# Patient Record
Sex: Male | Born: 1970 | Race: White | Hispanic: No | Marital: Married | Smoking: Never smoker
Health system: Southern US, Community
[De-identification: ages and names within clinical notes are randomized; demographics above are authoritative.]

---

## 2010-08-04 ENCOUNTER — Ambulatory Visit: Payer: Self-pay | Admitting: Family Medicine

## 2010-08-05 ENCOUNTER — Ambulatory Visit: Payer: Self-pay | Admitting: Internal Medicine

## 2011-05-26 ENCOUNTER — Other Ambulatory Visit: Payer: Self-pay | Admitting: Ophthalmology

## 2011-05-26 ENCOUNTER — Ambulatory Visit: Payer: Self-pay | Admitting: Internal Medicine

## 2013-04-14 ENCOUNTER — Ambulatory Visit: Payer: Self-pay | Admitting: Internal Medicine

## 2014-09-24 IMAGING — CR DG CHEST 2V
1 series · 3 of 3 positions shown · non-contrast
Comparison: none

REASON FOR EXAM: atypical CP
COMMENTS:

[Series 1: pa · 0.17mm/px · 3 of 3 slices shown]
[im 1/3]
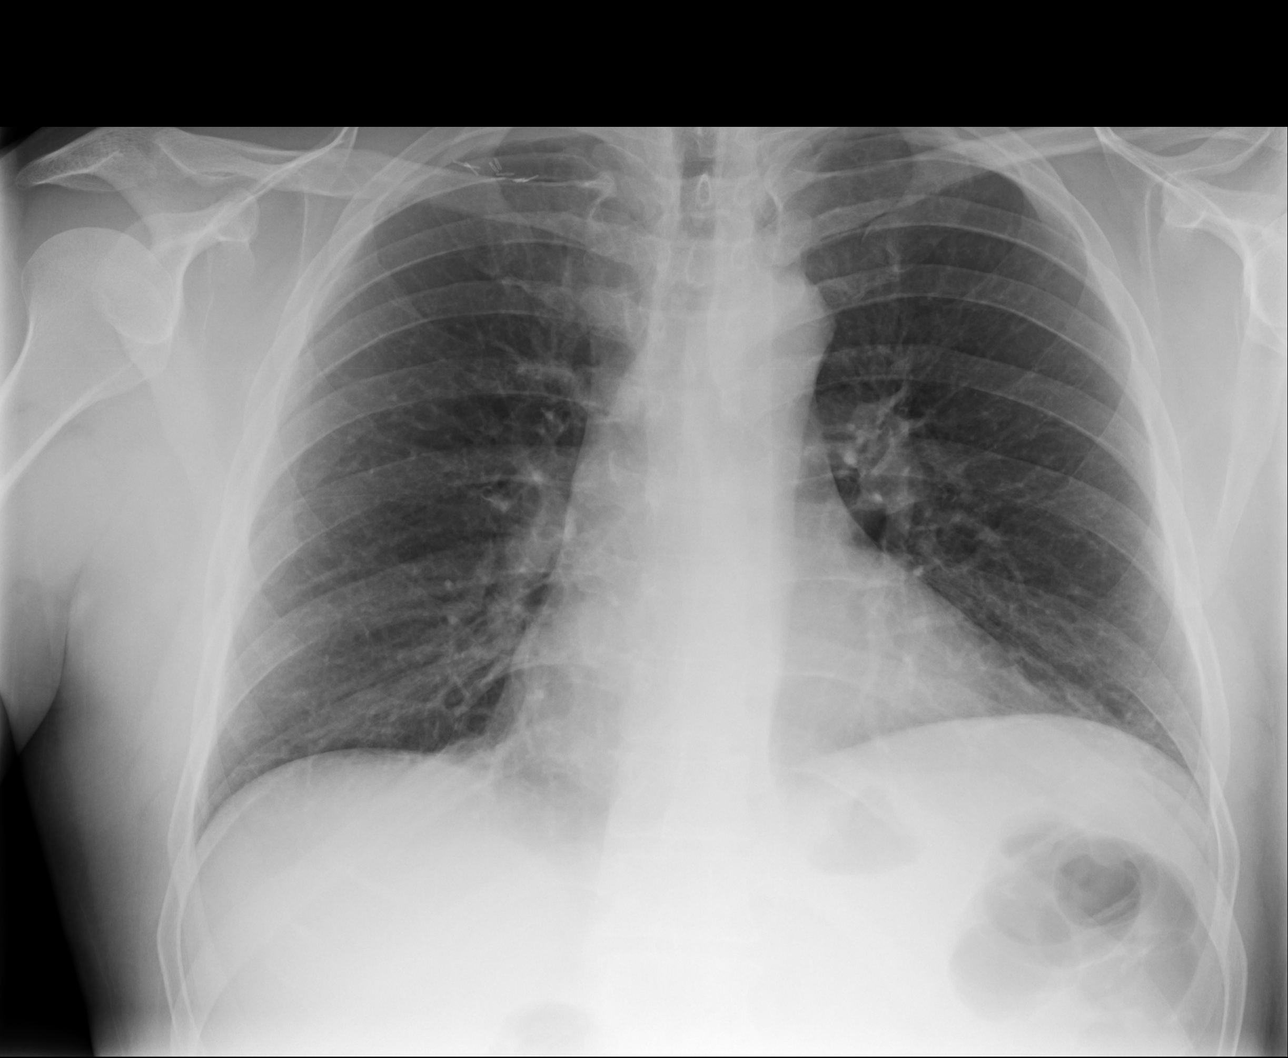
[im 2/3]
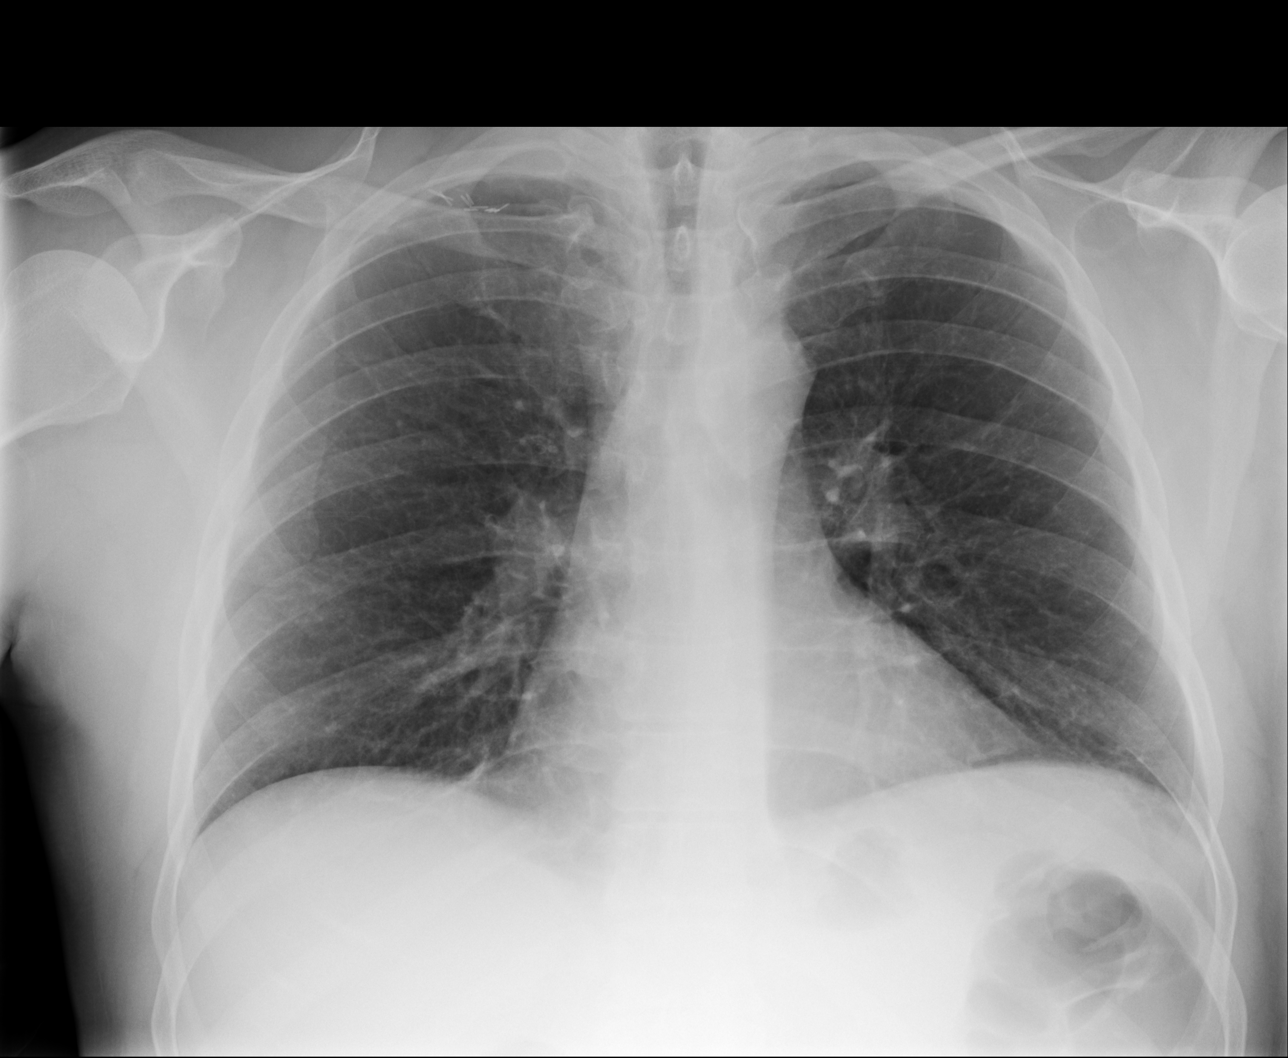
[im 3/3]
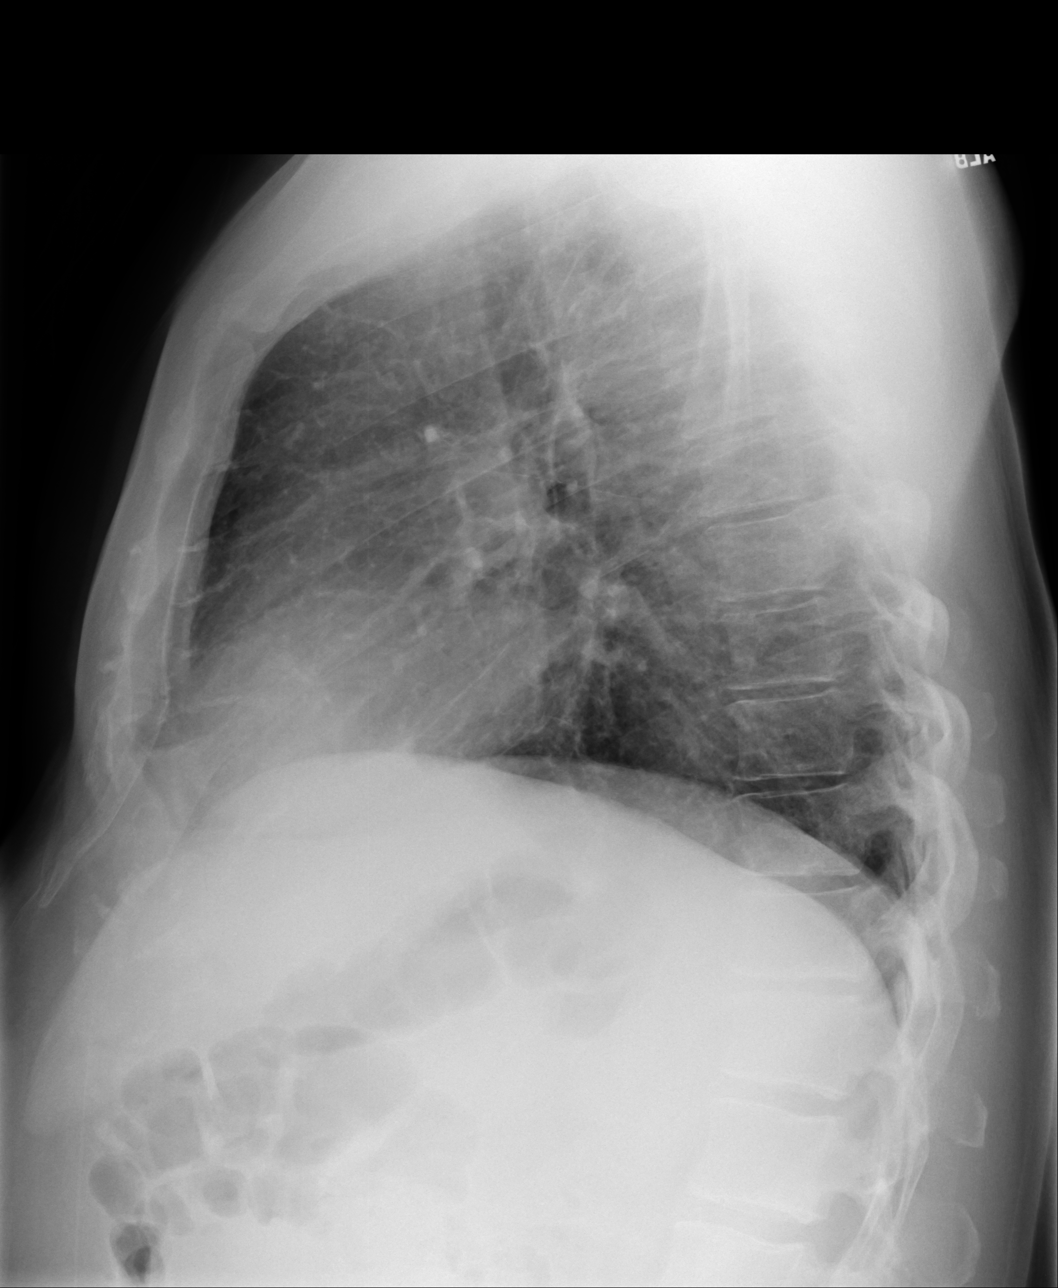

[3 of 3 positions shown; findings below may reference images not displayed]

PROCEDURE:     MDR - MDR CHEST PA(OR AP) AND LATERAL  - April 14, 2013  [DATE]

RESULT:     The lungs are reasonably well inflated. There is no pneumothorax
or pneumomediastinum. There are surgical clips in the pulmonary apex on the
right. The cardiac silhouette is normal in size. There is no pleural
effusion. The bony thorax is grossly normal. The sternum is grossly normal
in appearance. The the tip of the xiphoid exhibits a subtle curvature which
in the appropriate setting could reflect minimally angulated fracture
IMPRESSION: I do not see definite acute abnormality of the thorax.
There is subtle angulation of the distal aspect of the xiphoid which may be
normal for the patient but an acute minimally angulated fracture is not
excluded. If there are strong clinical concerns of occult thoracic injury,
CT scanning is available upon reque[REDACTED]

## 2014-09-24 IMAGING — CR STERNUM - 2+ VIEW
1 series · 4 of 4 positions shown · non-contrast
Comparison: none

REASON FOR EXAM: localized pain in xiphoid since yesterday (was lifting)
COMMENTS:

[Series 1: rao · 0.17mm/px · 4 of 4 slices shown]
[im 1/4]
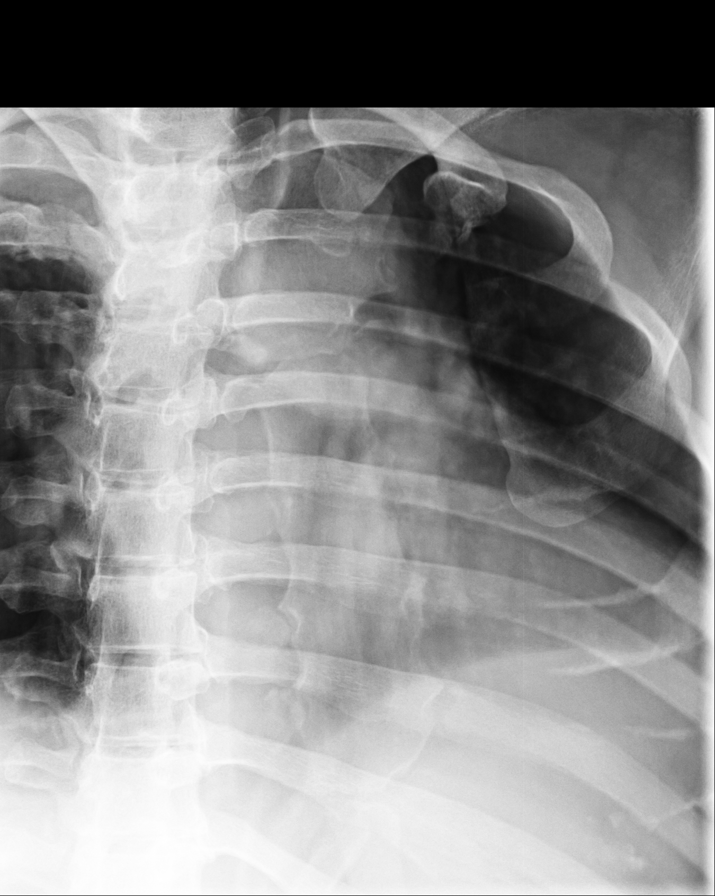
[im 2/4]
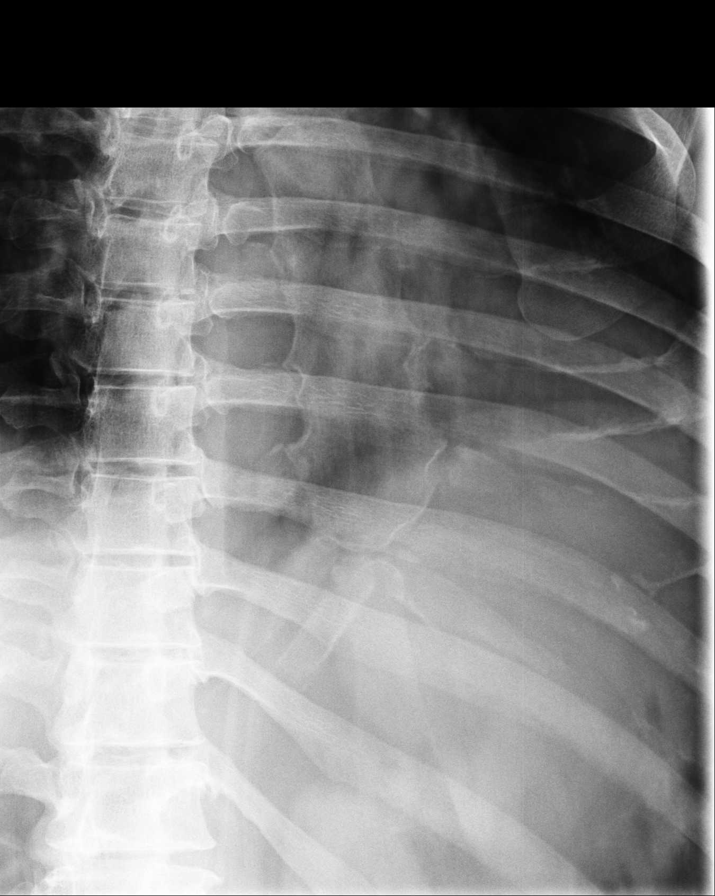
[im 3/4]
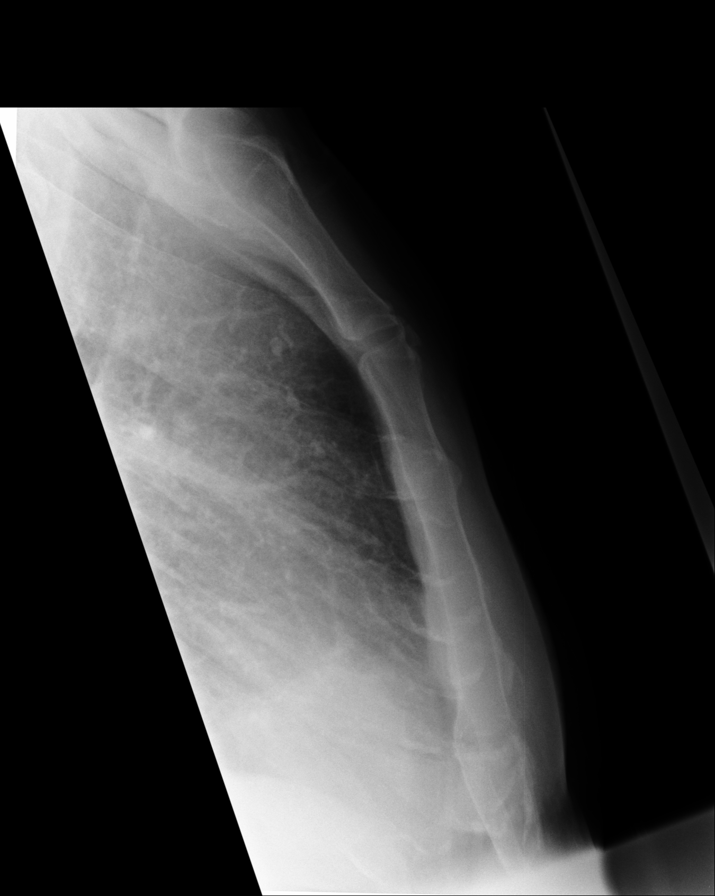
[im 4/4]
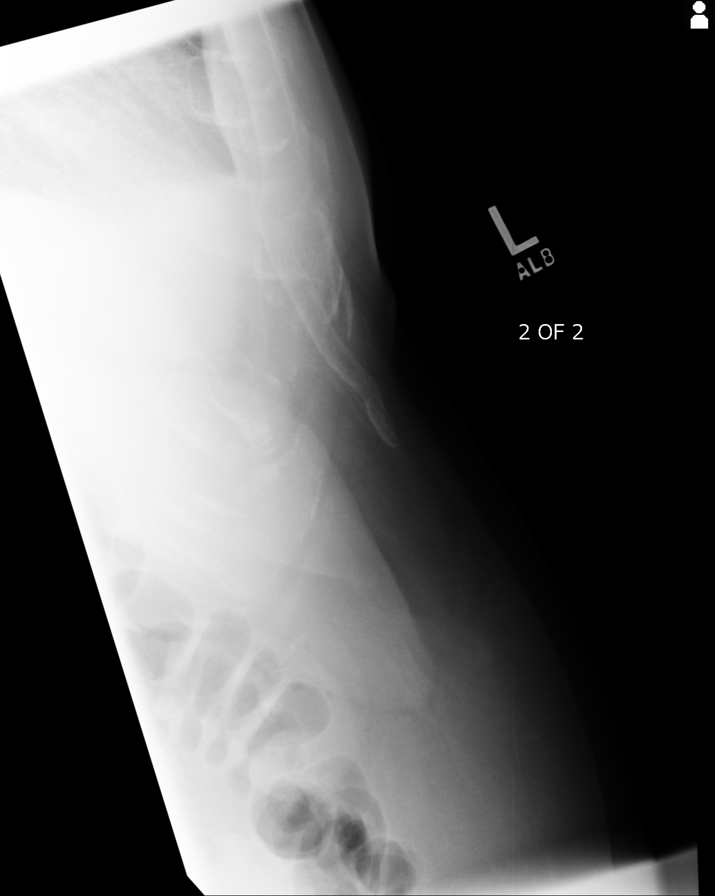

[4 of 4 positions shown; findings below may reference images not displayed]

PROCEDURE:     MDR - MDR STERNUM  - April 14, 2013  [DATE]

RESULT:     Lateral and oblique views of the sternum reveal the sternum to
be reasonably well mineralized. No fracture is demonstrated. The xiphoid
exhibits a subtle contour deformity near its tip which could reflect a
mildly angulated fracture.
IMPRESSION: I cannot exclude a fracture through the tip of the xiphoid.

[REDACTED]

## 2021-10-24 ENCOUNTER — Other Ambulatory Visit: Payer: Self-pay

## 2021-10-24 ENCOUNTER — Ambulatory Visit
Admission: EM | Admit: 2021-10-24 | Discharge: 2021-10-24 | Disposition: A | Discharge status: Home / Self Care | Payer: BC Managed Care – PPO | Attending: Emergency Medicine | Admitting: Emergency Medicine

## 2021-10-24 DIAGNOSIS — H60502 Unspecified acute noninfective otitis externa, left ear: Secondary | ICD-10-CM | POA: Diagnosis not present

## 2021-10-24 MED ORDER — NEOMYCIN-POLYMYXIN-HC 3.5-10000-1 OT SUSP: 4.0000 [drp] | Freq: Four times a day (QID) | OTIC | 0 refills | Status: AC

## 2021-10-24 NOTE — ED Triage Notes (Signed)
Pt here with C/O left ear painful and fullness for 2 weeks.

## 2021-10-24 NOTE — Discharge Instructions (Signed)
Instill 4 drops of Cortisporin otic in your left ear 4 times a day for 5 days for treatment of your ear infection.  Use over-the-counter Tylenol and ibuprofen as needed for pain or fever.  Place a hot water bottle, or heating pad, underneath your pillowcase at night to help dilate up your ear and aid in pain relief as well as resolution of the infection.  Return for reevaluation for any new or worsening symptoms.

## 2021-10-24 NOTE — ED Provider Notes (Signed)
[facility]    CSN: [id number] Arrival date & time: 10/24/21  0801      History   Chief Complaint Chief Complaint  Patient presents with   Otalgia    HPI Donald Barajas is a 50 y.o. male.   HPI  50 year old male here for evaluation of left ear pain.  Patient reports that he has been experiencing left ear pain and fullness for the past 2 weeks.  He indicates that he has noticed some dark drainage on his pillow in the last several days and that his hearing is markedly decreased in his ear.  He states it feels like it is swollen and it is very painful to lay on that ear.  He denies any fever, ringing in his ear, or upper respiratory symptoms.  Patient reports that he has had a cerumen impaction before that required irrigation.  He attempted to make an appointment with ENT but the earliest they could see him is November 13, 2021.  History reviewed. No pertinent past medical history.  There are no problems to display for this patient.   History reviewed. No pertinent surgical history.     Home Medications    Prior to Admission medications   Medication Sig Start Date End Date Taking? Authorizing Provider  neomycin-polymyxin-hydrocortisone (CORTISPORIN) 3.5-10000-1 OTIC suspension Place 4 drops into the left ear 4 (four) times daily. 10/24/21  Yes Becky Augusta, NP    Family History History reviewed. No pertinent family history.  Social History Social History   Tobacco Use   Smoking status: Never   Smokeless tobacco: Never  Vaping Use   Vaping Use: Never used  Substance Use Topics   Alcohol use: Yes    Alcohol/week: 20.0 standard drinks    Types: 20 Cans of beer per week   Drug use: Not Currently     Allergies   Patient has no allergy information on record.   Review of Systems Review of Systems  Constitutional:  Negative for activity change, appetite change and fever.  HENT:  Positive for ear discharge, ear pain and hearing loss. Negative for  congestion, rhinorrhea and tinnitus.   Hematological: Negative.   Psychiatric/Behavioral: Negative.      Physical Exam Triage Vital Signs ED Triage Vitals  Enc Vitals Group     BP 10/24/21 0816 (!) 135/94     Pulse Rate 10/24/21 0816 83     Resp 10/24/21 0816 18     Temp 10/24/21 0816 98.6 F (37 C)     Temp Source 10/24/21 0816 Oral     SpO2 10/24/21 0816 97 %     Weight 10/24/21 0815 212 lb (96.2 kg)     Height 10/24/21 0815 6' (1.829 m)     Head Circumference --      Peak Flow --      Pain Score 10/24/21 0815 5     Pain Loc --      Pain Edu? --      Excl. in GC? --    No data found.  Updated Vital Signs BP (!) 135/94 (BP Location: Left Arm)    Pulse 83    Temp 98.6 F (37 C) (Oral)    Resp 18    Ht 6' (1.829 m)    Wt 212 lb (96.2 kg)    SpO2 97%    BMI 28.75 kg/m   Visual Acuity Right Eye Distance:   Left Eye Distance:   Bilateral Distance:    Right Eye  Near:   Left Eye Near:    Bilateral Near:     Physical Exam Vitals and nursing note reviewed.  Constitutional:      General: He is not in acute distress.    Appearance: Normal appearance. He is not ill-appearing.  HENT:     Head: Normocephalic and atraumatic.     Right Ear: Tympanic membrane, ear canal and external ear normal. There is no impacted cerumen.     Ears:     Comments: The left external auditory canal is markedly edematous and erythematous.  There is a slight opening and I am able to visualize the tympanic membrane which is pearly gray in appearance.  There is no discharge present.  There is mild swelling to the auricle of the left ear as well but no erythema, induration, or fluctuance noted    Nose: Nose normal. No congestion or rhinorrhea.  Skin:    General: Skin is warm and dry.     Capillary Refill: Capillary refill takes less than 2 seconds.     Findings: No erythema, lesion or rash.  Neurological:     General: No focal deficit present.     Mental Status: He is alert and oriented to person,  place, and time.  Psychiatric:        Mood and Affect: Mood normal.        Behavior: Behavior normal.        Thought Content: Thought content normal.        Judgment: Judgment normal.     UC Treatments / Results  Labs (all labs ordered are listed, but only abnormal results are displayed) Labs Reviewed - No data to display  EKG   Radiology No results found.  Procedures Procedures (including critical care time)  Medications Ordered in UC Medications - No data to display  Initial Impression / Assessment and Plan / UC Course  I have reviewed the triage vital signs and the nursing notes.  Pertinent labs & imaging results that were available during my care of the patient were reviewed by me and considered in my medical decision making (see chart for details).  .  50 year old male here evaluation of left ear pain, decreased hearing, discharge has been going on for the past 2 weeks and worsening.  He does have a history of cerumen impaction in that ear approximately a year ago and he tried to get an appointment with ENT for evaluation but they were unable to see him until early January.  On exam patient has mild edema to the auricle of the left ear without erythema, induration, or fluctuance.  The left external auditory canal is markedly edematous without significant erythema or discharge.  I am able to visualize the tympanic membrane which is pearly gray.  The right TM is pearly gray as well with normal light reflex and clear external auditory canal.  The patient's exam is consistent with otitis externa.  I will start him on Cortisporin otic 4 drops 4 times daily for 5 days.  I have advised him that if he does not have any improvement within 24 to 36 hours he needs to follow-up with ENT for reevaluation or follow-up in the ER.  The patient does have an opening in his tympanic membrane which I am able to get a small 2.7 mm speculum through with difficulty and pain.  Patient could definitely  benefit from an ear wick but we do not have any available in the department at present.  Patient  verbalizes understanding of same.  He declines the need for work note.   Final Clinical Impressions(s) / UC Diagnoses   Final diagnoses:  Acute otitis externa of left ear, unspecified type     Discharge Instructions      Instill 4 drops of Cortisporin otic in your left ear 4 times a day for 5 days for treatment of your ear infection.  Use over-the-counter Tylenol and ibuprofen as needed for pain or fever.  Place a hot water bottle, or heating pad, underneath your pillowcase at night to help dilate up your ear and aid in pain relief as well as resolution of the infection.  Return for reevaluation for any new or worsening symptoms.      ED Prescriptions     Medication Sig Dispense Auth. Provider   neomycin-polymyxin-hydrocortisone (CORTISPORIN) 3.5-10000-1 OTIC suspension Place 4 drops into the left ear 4 (four) times daily. 10 mL Becky Augusta, NP      PDMP not reviewed this encounter.   Becky Augusta, NP 10/24/21 [phone]
